# Patient Record
Sex: Female | Born: 1965 | Race: White | Hispanic: No | Marital: Married | Smoking: Never smoker
Health system: Southern US, Community
[De-identification: ages and names within clinical notes are randomized; demographics above are authoritative.]

## PROBLEM LIST (undated history)

## (undated) DIAGNOSIS — I10 Essential (primary) hypertension: Secondary | ICD-10-CM

## (undated) HISTORY — PX: OTHER SURGICAL HISTORY: SHX169

## (undated) HISTORY — DX: Essential (primary) hypertension: I10

---

## 1996-01-12 HISTORY — PX: KIDNEY STONE SURGERY: SHX686

## 2016-09-22 ENCOUNTER — Encounter: Payer: Self-pay | Admitting: *Deleted

## 2016-09-23 ENCOUNTER — Ambulatory Visit (INDEPENDENT_AMBULATORY_CARE_PROVIDER_SITE_OTHER): Payer: Self-pay | Admitting: Neurology

## 2016-09-23 ENCOUNTER — Encounter: Payer: Self-pay | Admitting: Neurology

## 2016-09-23 ENCOUNTER — Ambulatory Visit: Payer: Self-pay | Admitting: Neurology

## 2016-09-23 VITALS — BP 127/81 | HR 87 | Ht 65.0 in | Wt 155.2 lb

## 2016-09-23 DIAGNOSIS — R51 Headache: Secondary | ICD-10-CM | POA: Diagnosis not present

## 2016-09-23 DIAGNOSIS — R29818 Other symptoms and signs involving the nervous system: Secondary | ICD-10-CM | POA: Diagnosis not present

## 2016-09-23 DIAGNOSIS — H02402 Unspecified ptosis of left eyelid: Secondary | ICD-10-CM

## 2016-09-23 DIAGNOSIS — I671 Cerebral aneurysm, nonruptured: Secondary | ICD-10-CM | POA: Diagnosis not present

## 2016-09-23 DIAGNOSIS — H4902 Third [oculomotor] nerve palsy, left eye: Secondary | ICD-10-CM

## 2016-09-23 DIAGNOSIS — R519 Headache, unspecified: Secondary | ICD-10-CM

## 2016-09-23 NOTE — Progress Notes (Signed)
[facility]    Provider:  Dr Jaynee Eagles Referring Provider: Maylon Peppers, MD Primary Care Physician:  Maylon Peppers, MD  CC:  Ptosis of left eye  HPI:  Wanda Villa is a 51 y.o. female here as a referral from Dr. Lenna Gilford for droopy eye. Started about a year ago. She saw an eye doctor, dilated exam looked healthy. She can feel the weakness, she can feel the weakness with difficulty opening the eye. She has irritation in the eye which is worsening, she also has headaches on the right, no hx of migraines, she has had 3-4 headaches, can be pressure and tension, worse on wakening,  the headaches can be manageable, she takes asa nd it helps, sleep helps, no light or sound sensitivity. She can wake with the headache. Never had headaches before, may happen several times a month for 3-4 days. Better in te morning, worse at night or tired. No weakness, no difficulty with vision or breathing, No pupil changes. No rashes, no tick bites, no inciting events or head injury or trauma. No sensory changes. No FHx of autoimmune disorders. She had psoriasis as a child. She does get some rashes on her arms. Eye worsens at night and with lots of physical activity. Eye pain on movement of the left eye. No other focal neurologic deficits, associated symptoms, inciting events or modifiable factors. Husband here and provides much information.  Reviewed notes, labs and imaging from outside physicians, which showed:  Reviewed primary care notes. Patient has a past medical history of hypertension. Complaining of weakness and irritation in the left eye and November 2017, was seen by eye doctor and normal exam, exam was normal including neurologic exam,    Review of Systems: Patient complains of symptoms per HPI as well as the following symptoms: no CP, no SOB. Pertinent negatives and positives per HPI. All others negative.   Social History   Social History  . Marital status: Married    Spouse name:  N/A  . Number of children: N/A  . Years of education: N/A   Occupational History  . Not on file.   Social History Main Topics  . Smoking status: Never Smoker  . Smokeless tobacco: Never Used  . Alcohol use Yes     Comment: occasional  . Drug use: No  . Sexual activity: Not on file   Other Topics Concern  . 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  3 children.      Family History  Problem Relation Age of Onset  . Cancer Father   . Heart disease Father   . Atrial fibrillation Mother   . Autoimmune disease Neg Hx     Past Medical History:  Diagnosis Date  . Hypertension     Past Surgical History:  Procedure Laterality Date  . cesarean    . [place]  . tummy tuck      Current Outpatient Prescriptions  Medication Sig Dispense Refill  . lisinopril (PRINIVIL,ZESTRIL) 5 MG tablet Take 5 mg by mouth daily.    Marland Kitchen neomycin-polymyxin-dexamethasone (MAXITROL) 0.1 % ophthalmic suspension Place 2 drops into the left eye daily.     No current facility-administered medications for this visit.     Allergies as of 09/23/2016 - Review Complete 09/23/2016  Allergen Reaction Noted  . Codeine  09/22/2016    Vitals: BP 127/81   Pulse 87   Ht 5\' 5"  (1.651  m)   Wt 155 lb 3.2 oz (70.4 kg)   BMI 25.83 kg/m  Last Weight:  Wt Readings from Last 1 Encounters:  09/23/16 155 lb 3.2 oz (70.4 kg)   Last Height:   Ht Readings from Last 1 Encounters:  09/23/16 5\' 5"  (1.651 m)    Physical exam: Exam: Gen: NAD, conversant, well nourised, obese, well groomed                     CV: RRR, no MRG. No Carotid Bruits. No peripheral edema, warm, nontender Eyes: Conjunctivae clear without exudates or hemorrhage  Neuro: Detailed Neurologic Exam  Speech:    Speech is normal; fluent and spontaneous with normal comprehension.  Cognition:    The patient is oriented to person, place, and time;     recent and remote memory  intact;     language fluent;     normal attention, concentration,     fund of knowledge Cranial Nerves:    The pupils are equal, round, and reactive to light. The fundi are normal and spontaneous venous pulsations are present. Visual fields are full to finger confrontation. Extraocular movements are intact. Trigeminal sensation is intact and the muscles of mastication are normal. Left Ptosis. The palate elevates in the midline. Hearing intact. Voice is normal. Shoulder shrug is normal. The tongue has normal motion without fasciculations.   Coordination:    Normal finger to nose and heel to shin. Normal rapid alternating movements.   Gait:    Heel-toe and tandem gait are normal.   Motor Observation:    No asymmetry, no atrophy, and no involuntary movements noted. Tone:    Normal muscle tone.    Posture:    Posture is normal. normal erect    Strength:    Strength is V/V in the upper and lower limbs.      Sensation: intact to LT     Reflex Exam:  DTR's:    Deep tendon reflexes in the upper and lower extremities are normal bilaterally.   Toes:    The toes are downgoing bilaterally.   Clonus:    Clonus is absent.  Ice pack test negative    Assessment/Plan:  Patient with left ptosis that worsens woth fatigue or later in the day. Otherwise EOMI and not fatiguable, no proximal weakness, ice pack test was negatibe  MRI brain, MRA head to evaluate for third nerve palsy due to strokes, IIH, aneurysm, lesions MRi orbits for orbital pseudotumor given pain on eye movement Labs today  If unrevealing or suggests Myasthenia Gravis, CT of the chest recommended  Orders Placed This Encounter  Procedures  . MR BRAIN W WO CONTRAST  . MR ORBITS W WO CONTRAST  . MR MRA HEAD WO CONTRAST  . Acetylcholine receptor, binding  . Acetylcholine receptor, blocking  . Acetylcholine receptor, modulating  . CK  . Comprehensive metabolic panel  . CBC    Sarina Ill, MD  [facility] [address] [place], [place] [address]  Phone [phone] Fax [phone]

## 2016-09-23 NOTE — Patient Instructions (Signed)
Remember to drink plenty of fluid, eat healthy meals and do not skip any meals. Try to eat protein with a every meal and eat a healthy snack such as fruit or nuts in between meals. Try to keep a regular sleep-wake schedule and try to exercise daily, particularly in the form of walking, 20-30 minutes a day, if you can.   As far as diagnostic testing: MRI of the brain and orbits, MRA head for blood vessels. May consider chest imaging as well, lab  I would like to see you back in 6 weeks, sooner if we need to. Please call us with any interim questions, concerns, problems, updates or refill requests.    Our phone number is [phone]. We also have an after hours call service for urgent matters and there is a physician on-call for urgent questions. For any emergencies you know to call 911 or go to the nearest emergency room  Myasthenia Gravis Myasthenia gravis (MG) means severe weakness. It is a long-term (chronic) condition that causes weakness in the muscles you can control (voluntary muscles). MG can affect any voluntary muscle. The muscles most often affected are the ones that control:  Eye movement.  Facial movements.  Swallowing.  MG is an autoimmune disease, which means that your body's defense system (immune system) attacks healthy parts of your body instead of germs and other things that make you sick. When you have MG, your immune system makes proteins (antibodies) that block the chemical (acetylcholine) your body needs to send nerve signals to your muscles. This causes muscle weakness. What are the causes? The exact cause of MG is unknown. One possible cause is an enlarged thymus gland, which is located under your breastbone. What are the signs or symptoms? The earliest symptom of MG is muscle weakness that gets worse with activity and gets better after rest. Other symptoms of MG may include:  Drooping eyelids.  Double vision.  Loss of facial expression.  Trouble chewing and  swallowing.  Slurred speech.  A waddling walk.  Weakness of the arms, hands, and legs.  Trouble breathing is the most dangerous symptom of MG. Sudden and severe difficulty breathing (myasthenic crisis) may require emergency breathing support. This symptom sometimes happens after:  Infection.  Fever.  Drug reaction.  How is this diagnosed? It can be hard to diagnose MG because muscle weakness is a common symptom in many conditions. Your health care provider will do a physical exam. You may also have tests that will help make a diagnosis. These may include:  A blood test.  A test using the medicine edrophonium. This medicine increases muscle strength by slowing the breakdown of acetylcholine.  Tests to measure nerve conduction to muscle (electromyography).  An imaging study of the chest (CT or MRI).  How is this treated? Treatment can improve muscle strength. Sometimes symptoms of MG go away for a while (remission) and you can stop treatment. Possible treatments include:  Medicine.  Removal of the thymus gland (thymectomy). This may result in a long remission for some people.  Follow these instructions at home:  Take medicines only as directed by your health care provider.  Get plenty of rest to conserve your energy.  Take frequent breaks to rest your eyes.  Maintain a healthy diet and a healthy weight.  Do not use any tobacco products including cigarettes, chewing tobacco, or electronic cigarettes. If you need help quitting, ask your health care provider.  Keep all follow-up visits as directed by your health care  provider. This is important. Contact a health care provider if:  Your symptoms get worse after a fever or infection.  You have a reaction to a medicine you are taking.  Your symptoms change or get worse. Get help right away if: You have trouble breathing. This information is not intended to replace advice given to you by your health care provider. Make  sure you discuss any questions you have with your health care provider. Document Released: 04/05/2000 Document Revised: 06/05/2015 Document Reviewed: 02/28/2013 Elsevier Interactive Patient Education  [employer].

## 2016-09-26 ENCOUNTER — Encounter: Payer: Self-pay | Admitting: Neurology

## 2016-09-26 DIAGNOSIS — H02402 Unspecified ptosis of left eyelid: Secondary | ICD-10-CM | POA: Insufficient documentation

## 2016-09-27 ENCOUNTER — Telehealth: Payer: Self-pay | Admitting: Neurology

## 2016-09-27 NOTE — Telephone Encounter (Signed)
Rescind the MRA and we will just do the MRI orbits ans bran and can consider MRA later thanks

## 2016-09-27 NOTE — Telephone Encounter (Signed)
I was unable to get the MRA approved because of the combination of the MRI Brain and MRA Head together.. I did get the MRI Brain and MR Orbits approved. They are linked together I was unable to get a authorization number. The phone number for the peer to peer is [phone]. The member ID is [id number] & the DOB is 02/13/1965. The case does close on Wednesday 09/29/16.

## 2016-09-27 NOTE — Telephone Encounter (Signed)
I called [employer] and withdrew the MRA head and will proceed with the MRI Brain & MR Orbits.

## 2016-09-28 ENCOUNTER — Telehealth: Payer: Self-pay | Admitting: *Deleted

## 2016-09-28 LAB — COMPREHENSIVE METABOLIC PANEL
A/G RATIO: 1.9 (ref 1.2–2.2)
ALT: 26 IU/L (ref 0–32)
AST: 29 IU/L (ref 0–40)
Albumin: 4.6 g/dL (ref 3.5–5.5)
Alkaline Phosphatase: 52 IU/L (ref 39–117)
BUN/Creatinine Ratio: 16 (ref 9–23)
BUN: 13 mg/dL (ref 6–24)
Bilirubin Total: 0.7 mg/dL (ref 0.0–1.2)
CALCIUM: 9.7 mg/dL (ref 8.7–10.2)
CO2: 26 mmol/L (ref 20–29)
CREATININE: 0.8 mg/dL (ref 0.57–1.00)
Chloride: 101 mmol/L (ref 96–106)
GFR, EST AFRICAN AMERICAN: 100 mL/min/{1.73_m2} (ref >59)
GFR, EST NON AFRICAN AMERICAN: 87 mL/min/{1.73_m2} (ref >59)
Globulin, Total: 2.4 g/dL (ref 1.5–4.5)
Glucose: 86 mg/dL (ref 65–99)
Potassium: 4.7 mmol/L (ref 3.5–5.2)
Sodium: 140 mmol/L (ref 134–144)
TOTAL PROTEIN: 7 g/dL (ref 6.0–8.5)

## 2016-09-28 LAB — CBC
HEMATOCRIT: 41.8 % (ref 34.0–46.6)
Hemoglobin: 13.6 g/dL (ref 11.1–15.9)
MCH: 29.1 pg (ref 26.6–33.0)
MCHC: 32.5 g/dL (ref 31.5–35.7)
MCV: 90 fL (ref 79–97)
Platelets: 227 10*3/uL (ref 150–379)
RBC: 4.67 x10E6/uL (ref 3.77–5.28)
RDW: 14.6 % (ref 12.3–15.4)
WBC: 4.7 10*3/uL (ref 3.4–10.8)

## 2016-09-28 LAB — ACETYLCHOLINE RECEPTOR, BLOCKING: Acetylchol Block Ab: 11 % (ref 0–25)

## 2016-09-28 LAB — CK: CK TOTAL: 109 U/L (ref 24–173)

## 2016-09-28 LAB — ACETYLCHOLINE RECEPTOR, MODULATING: Acetylcholine Modulat Ab: <12 % (ref 0–20)

## 2016-09-28 LAB — ACETYLCHOLINE RECEPTOR, BINDING: AChR Binding Ab, Serum: <0.03 nmol/L (ref 0.00–0.24)

## 2016-09-28 NOTE — Telephone Encounter (Signed)
-----   Message from Melvenia Beam, MD sent at 09/28/2016  3:50 PM EDT ----- Labs normal

## 2016-09-28 NOTE — Telephone Encounter (Signed)
Called and spoke with patient about normal labs per AA,MD note. Pt verbalized understanding.  She is going for imaging tomorrow. Advised we will call with results once back.

## 2016-09-29 ENCOUNTER — Ambulatory Visit (INDEPENDENT_AMBULATORY_CARE_PROVIDER_SITE_OTHER): Payer: BLUE CROSS/BLUE SHIELD

## 2016-09-29 DIAGNOSIS — H02402 Unspecified ptosis of left eyelid: Secondary | ICD-10-CM | POA: Diagnosis not present

## 2016-09-29 DIAGNOSIS — R519 Headache, unspecified: Secondary | ICD-10-CM

## 2016-09-29 DIAGNOSIS — R51 Headache: Secondary | ICD-10-CM

## 2016-09-29 DIAGNOSIS — H4902 Third [oculomotor] nerve palsy, left eye: Secondary | ICD-10-CM

## 2016-09-29 DIAGNOSIS — R29818 Other symptoms and signs involving the nervous system: Secondary | ICD-10-CM

## 2016-09-29 MED ORDER — GADOPENTETATE DIMEGLUMINE 469.01 MG/ML IV SOLN: 20.0000 mL | Freq: Once | INTRAVENOUS | Status: AC | PRN

## 2016-10-03 ENCOUNTER — Other Ambulatory Visit: Payer: Self-pay | Admitting: Neurology

## 2016-10-03 DIAGNOSIS — H02402 Unspecified ptosis of left eyelid: Secondary | ICD-10-CM

## 2016-10-03 DIAGNOSIS — D150 Benign neoplasm of thymus: Secondary | ICD-10-CM

## 2016-10-03 DIAGNOSIS — I671 Cerebral aneurysm, nonruptured: Secondary | ICD-10-CM

## 2016-10-03 DIAGNOSIS — H4902 Third [oculomotor] nerve palsy, left eye: Secondary | ICD-10-CM

## 2016-10-03 DIAGNOSIS — D4989 Neoplasm of unspecified behavior of other specified sites: Secondary | ICD-10-CM

## 2016-10-03 DIAGNOSIS — G529 Cranial nerve disorder, unspecified: Secondary | ICD-10-CM

## 2016-10-03 DIAGNOSIS — G7000 Myasthenia gravis without (acute) exacerbation: Secondary | ICD-10-CM

## 2016-10-12 ENCOUNTER — Ambulatory Visit
Admission: RE | Admit: 2016-10-12 | Discharge: 2016-10-12 | Disposition: A | Discharge status: Home / Self Care | Payer: BLUE CROSS/BLUE SHIELD | Source: Ambulatory Visit | Attending: Neurology | Admitting: Neurology

## 2016-10-12 DIAGNOSIS — I671 Cerebral aneurysm, nonruptured: Secondary | ICD-10-CM

## 2016-10-12 DIAGNOSIS — G529 Cranial nerve disorder, unspecified: Secondary | ICD-10-CM

## 2016-10-12 DIAGNOSIS — D4989 Neoplasm of unspecified behavior of other specified sites: Secondary | ICD-10-CM

## 2016-10-12 DIAGNOSIS — H4902 Third [oculomotor] nerve palsy, left eye: Secondary | ICD-10-CM | POA: Diagnosis not present

## 2016-10-12 DIAGNOSIS — H02402 Unspecified ptosis of left eyelid: Secondary | ICD-10-CM

## 2016-10-12 DIAGNOSIS — D150 Benign neoplasm of thymus: Secondary | ICD-10-CM

## 2016-10-12 DIAGNOSIS — G7000 Myasthenia gravis without (acute) exacerbation: Secondary | ICD-10-CM

## 2016-10-12 MED ORDER — IOPAMIDOL (ISOVUE-300) INJECTION 61%
75.0000 mL | Freq: Once | INTRAVENOUS | Status: AC | PRN
  Administered 2016-10-12: 75 mL via INTRAVENOUS

## 2016-10-13 ENCOUNTER — Telehealth: Payer: Self-pay

## 2016-10-13 IMAGING — CT CT CHEST W/ CM
1 of 2 series · 15 of 32 positions shown, 19 images · IV contrast (APPLIED)
Comparison: No priors.

CLINICAL DATA: 51-year-old female with history of myasthenia
gravis. Evaluate for potential thymoma.

EXAM:
CT CHEST WITH CONTRAST
TECHNIQUE: Multidetector CT imaging of the chest was performed during
intravenous contrast administration.
CONTRAST:  75mL [P4] IOPAMIDOL ([P4]) INJECTION 61%

[Series 4: super d · axial · 0.69mm/px · z∈[-310,-20]mm · 15 of 461 slices shown, 19 images]
[im 24/461  mediastinal]
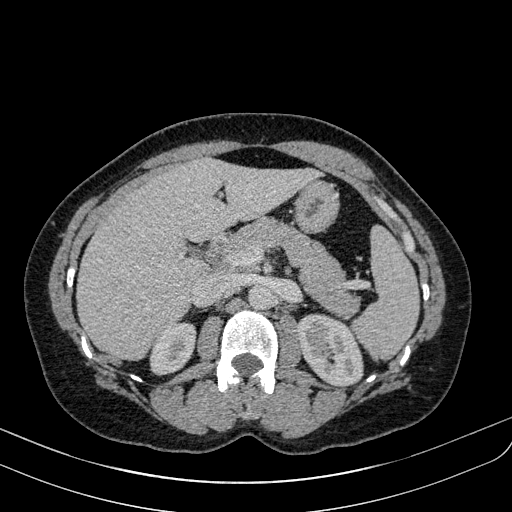
[im 24/461  lung]
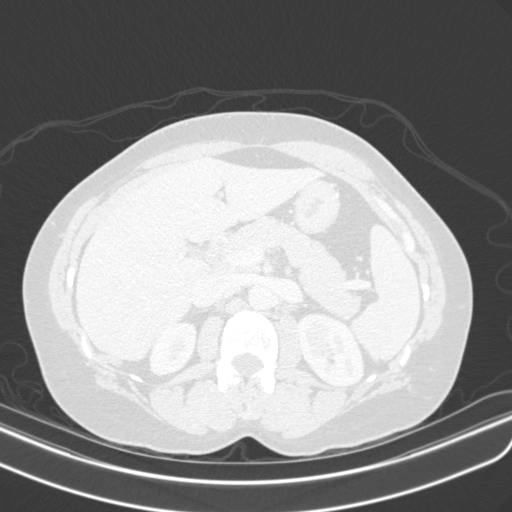
[im 70/461  lung]
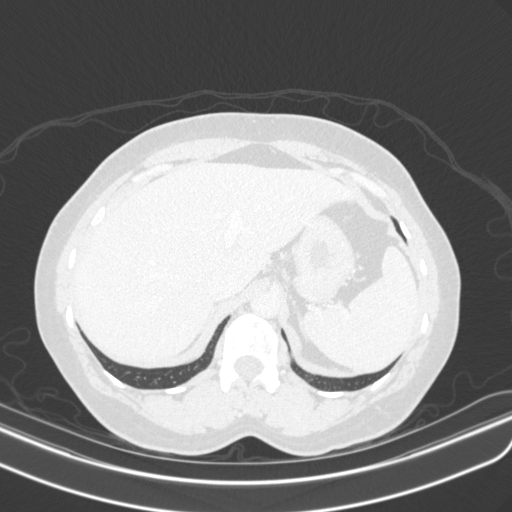
[im 93/461  lung]
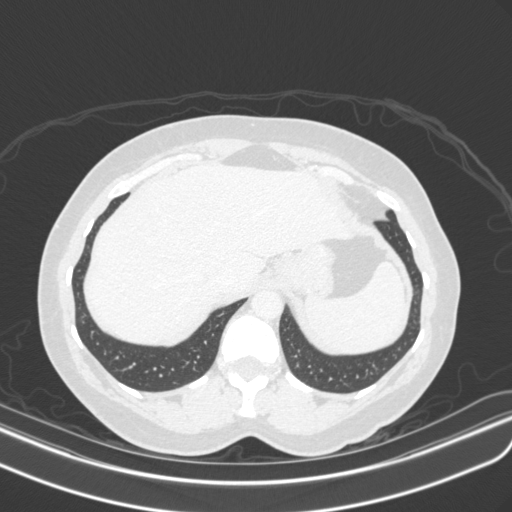
[im 139/461  lung]
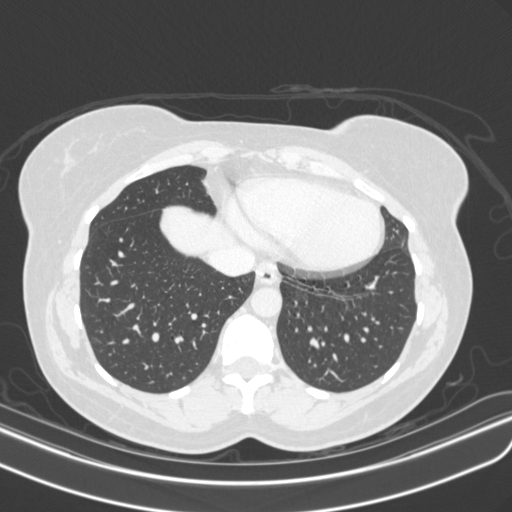
[im 154/461  mediastinal]
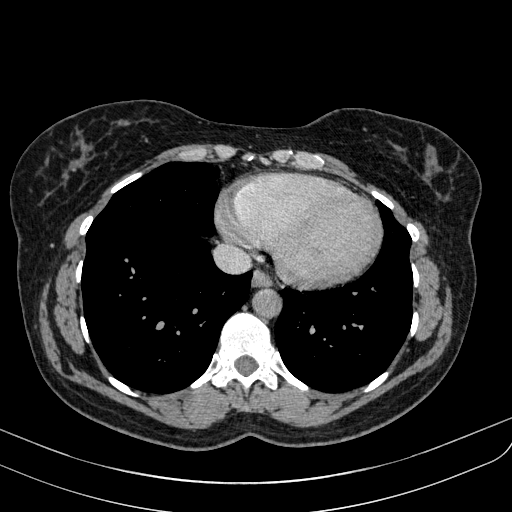
[im 154/461  lung]
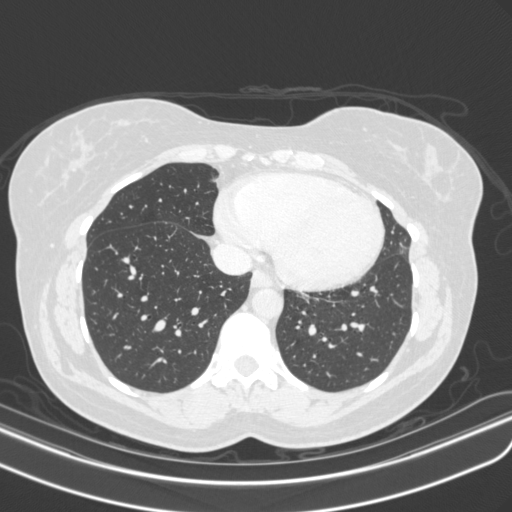
[im 185/461  lung]
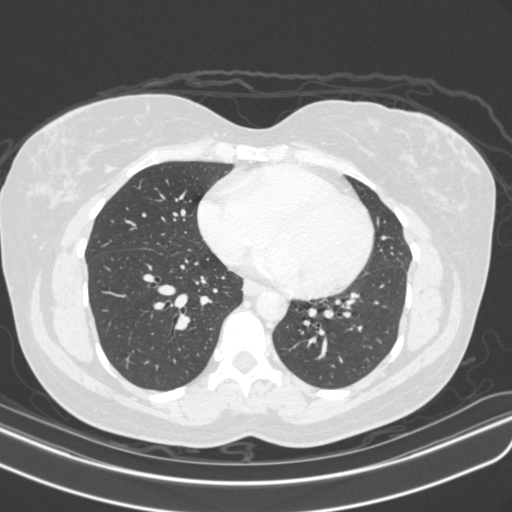
[im 208/461  lung]
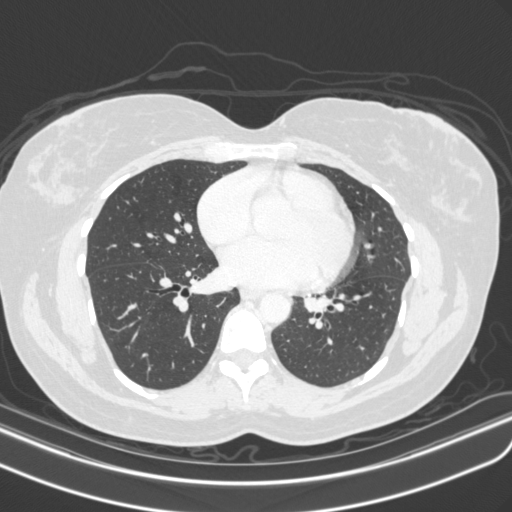
[im 231/461  lung]
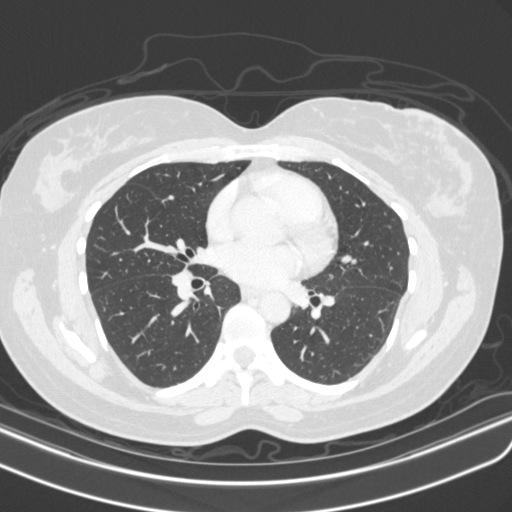
[im 254/461  mediastinal]
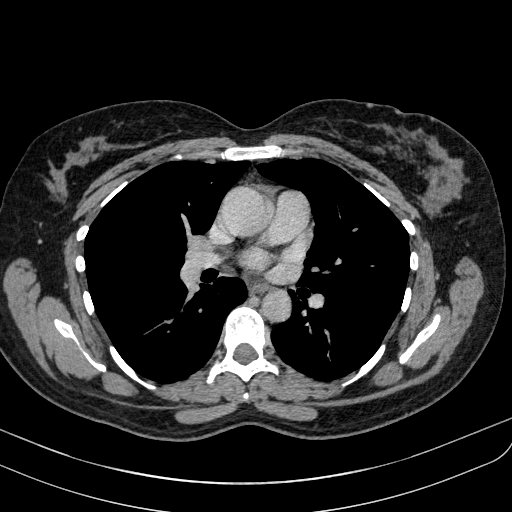
[im 254/461  lung]
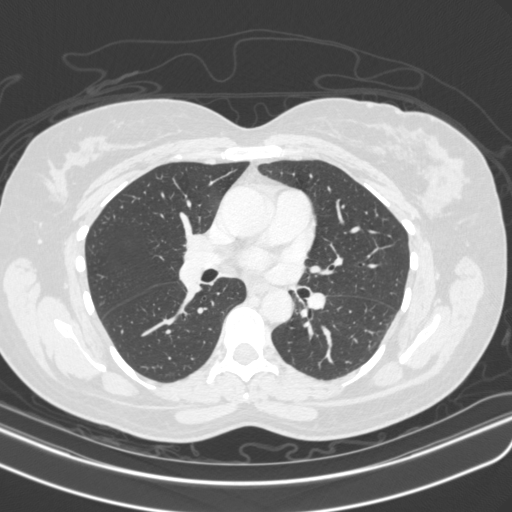
[im 277/461  lung]
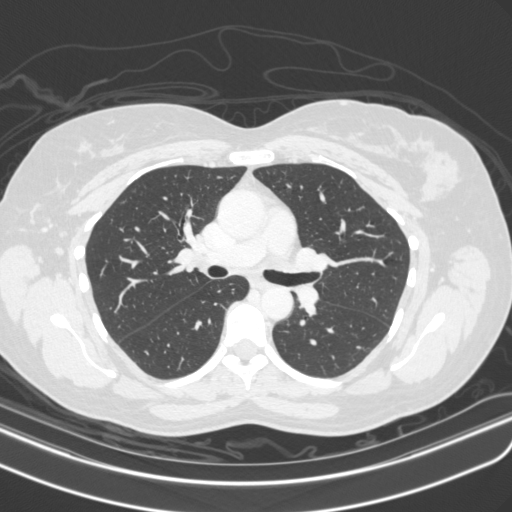
[im 307/461  lung]
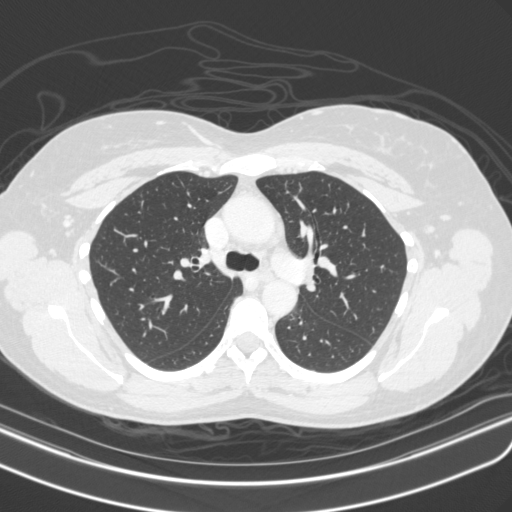
[im 323/461  lung]
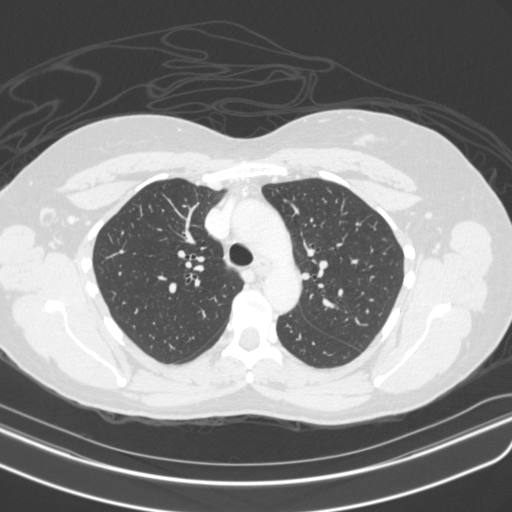
[im 369/461  mediastinal]
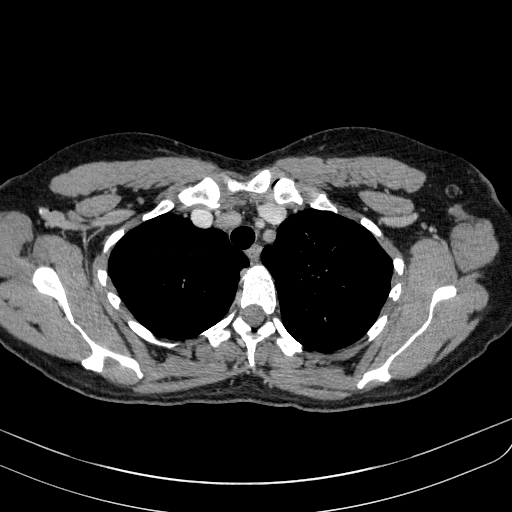
[im 369/461  lung]
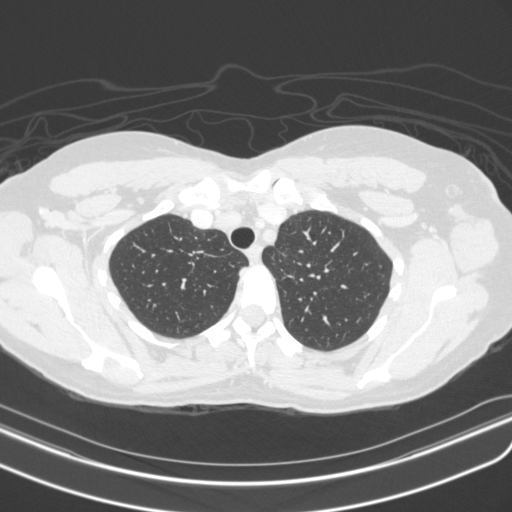
[im 392/461  lung]
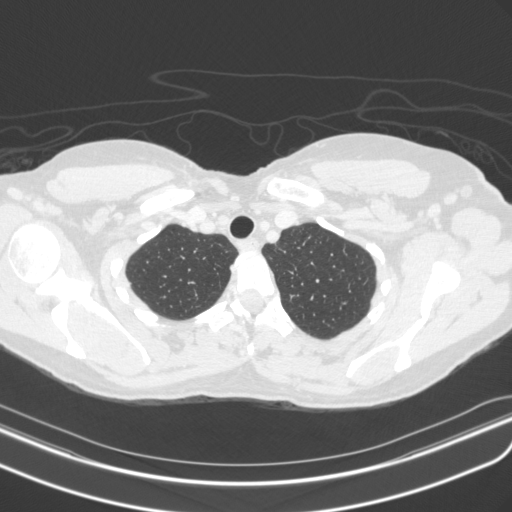
[im 438/461  lung]
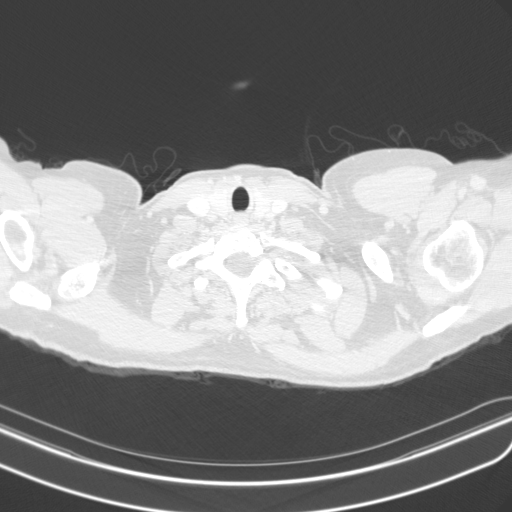

[15 of 32 positions shown; findings below may reference images not displayed]

FINDINGS: Cardiovascular: Heart size is normal. There is no significant
pericardial fluid, thickening or pericardial calcification. No
significant atherosclerotic disease, aneurysm or dissection of the
thoracic aorta. No coronary artery calcifications are noted.

Mediastinum/Nodes: No unexpected soft tissue mass in the anterior
mediastinum to suggest the presence of thymoma. No pathologically
enlarged mediastinal or hilar lymph nodes. Esophagus is unremarkable
in appearance. No axillary lymphadenopathy.

Lungs/Pleura: No suspicious appearing pulmonary nodules or masses.
No acute consolidative airspace disease. No pleural effusions.

Upper Abdomen: Unremarkable.

Musculoskeletal: There are no aggressive appearing lytic or blastic
lesions noted in the visualized portions of the skeleton.
IMPRESSION: 1. No evidence of thymoma.
2. No acute findings noted in the thorax.

## 2016-10-13 NOTE — Telephone Encounter (Signed)
-----   Message from Lester Spalding, RN sent at 10/13/2016  4:09 PM EDT -----   ----- Message ----- From: Melvenia Beam, MD Sent: 10/12/2016   7:58 PM To: Lester [place], RN  CT of the chest normal thanks

## 2016-10-13 NOTE — Telephone Encounter (Signed)
I spoke with patient and she is aware that CT of the chest is normal. She asked about her MRI/MRA results but I made her aware that they had not been read yet but that we would call her just as soon as those results are back. Patient voiced understanding.

## 2016-10-15 ENCOUNTER — Telehealth: Payer: Self-pay | Admitting: *Deleted

## 2016-10-15 NOTE — Telephone Encounter (Signed)
Patient called and discussed results of MRA. No further concerns. Patient to follow up at next appointment on 11/04/16.

## 2016-11-04 ENCOUNTER — Ambulatory Visit (INDEPENDENT_AMBULATORY_CARE_PROVIDER_SITE_OTHER): Payer: BLUE CROSS/BLUE SHIELD | Admitting: Neurology

## 2016-11-04 ENCOUNTER — Other Ambulatory Visit: Payer: Self-pay | Admitting: Neurology

## 2016-11-04 ENCOUNTER — Telehealth: Payer: Self-pay

## 2016-11-04 ENCOUNTER — Encounter: Payer: Self-pay | Admitting: Neurology

## 2016-11-04 VITALS — BP 135/87 | HR 88 | Wt 158.8 lb

## 2016-11-04 DIAGNOSIS — G7000 Myasthenia gravis without (acute) exacerbation: Secondary | ICD-10-CM

## 2016-11-04 MED ORDER — PYRIDOSTIGMINE BROMIDE 60 MG PO TABS: 60.0000 mg | ORAL_TABLET | Freq: Three times a day (TID) | ORAL | 6 refills | Status: DC

## 2016-11-04 MED ORDER — PYRIDOSTIGMINE BROMIDE 60 MG PO TABS: 60.0000 mg | ORAL_TABLET | Freq: Three times a day (TID) | ORAL | 6 refills | Status: AC

## 2016-11-04 NOTE — Telephone Encounter (Signed)
Patient's prescription today was sent to [facility], [place], she needs it resent to the [facility] in [place], [place], thanks!

## 2016-11-04 NOTE — Progress Notes (Signed)
[id number]    Provider:  Dr Jaynee Eagles Referring Provider: Maylon Peppers, MD Primary Care Physician:  Maylon Peppers, MD  CC:  Ptosis of left eye  Interval history 10/25 2018: Patient is here for follow-up of left ptosis. Extensive testing including MRI is, MRA, lab work including myasthenia gravis antibodies have been negative. Ptosis is continuous and fluctuates based on time of day, negative ice pack test, unclear if this is ocular myasthenia gravis. Next is could be single fiber EMG, monitoring clinically for further progression, or treating with a course of high-dose steroids and or Mestinon. No vision problems. She has migraines but the ptosis is not coincident with migraines.   HPI:  Wanda Villa is a 51 y.o. female here as a referral from Dr. Lenna Gilford for droopy eye. Started about a year ago. She saw an eye doctor, dilated exam looked healthy. She can feel the weakness, she can feel the weakness with difficulty opening the eye. She has irritation in the eye which is worsening, she also has headaches on the right, no hx of migraines, she has had 3-4 headaches, can be pressure and tension, worse on wakening,  the headaches can be manageable, she takes asa nd it helps, sleep helps, no light or sound sensitivity. She can wake with the headache. Never had headaches before, may happen several times a month for 3-4 days. Better in te morning, worse at night or tired. No weakness, no difficulty with vision or breathing, No pupil changes. No rashes, no tick bites, no inciting events or head injury or trauma. No sensory changes. No FHx of autoimmune disorders. She had psoriasis as a child. She does get some rashes on her arms. Eye worsens at night and with lots of physical activity. Eye pain on movement of the left eye. No other focal neurologic deficits, associated symptoms, inciting events or modifiable factors. Husband here and provides much information.  Reviewed notes, labs and  imaging from outside physicians, which showed:  Reviewed primary care notes. Patient has a past medical history of hypertension. Complaining of weakness and irritation in the left eye and November 2017, was seen by eye doctor and normal exam, exam was normal including neurologic exam,    Review of Systems: Patient complains of symptoms per HPI as well as the following symptoms: no CP, no SOB. Pertinent negatives and positives per HPI. All others negative.  Social History   Social History  . Marital status: Married    Spouse name: N/A  . Number of children: N/A  . Years of education: N/A   Occupational History  . Not on file.   Social History Main Topics  . Smoking status: Never Smoker  . Smokeless tobacco: Never Used  . Alcohol use Yes     Comment: occasional  . Drug use: No  . Sexual activity: Not on file   Other Topics Concern  . 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  3 children.      Family History  Problem Relation Age of Onset  . Cancer Father   . Heart disease Father   . Atrial fibrillation Mother   . Autoimmune disease Neg Hx     Past Medical History:  Diagnosis Date  . Hypertension     Past Surgical History:  Procedure Laterality Date  . cesarean    . [place]  . tummy tuck      Current  Outpatient Prescriptions  Medication Sig Dispense Refill  . pyridostigmine (MESTINON) 60 MG tablet Take 1 tablet (60 mg total) by mouth 3 (three) times daily. 90 tablet 6   No current facility-administered medications for this visit.    Facility-Administered Medications Ordered in Other Visits  Medication Dose Route Frequency Provider Last Rate Last Dose  . gadopentetate dimeglumine (MAGNEVIST) injection 20 mL  20 mL Intravenous Once PRN Melvenia Beam, MD        Allergies as of 11/04/2016 - Review Complete 11/04/2016  Allergen Reaction Noted  . Codeine  09/22/2016    Vitals: BP 135/87    Pulse 88   Wt 158 lb 12.8 oz (72 kg)   BMI 26.43 kg/m  Last Weight:  Wt Readings from Last 1 Encounters:  11/04/16 158 lb 12.8 oz (72 kg)   Last Height:   Ht Readings from Last 1 Encounters:  09/23/16 5\' 5"  (1.651 m)   Physical exam: Exam: Gen: NAD, conversant, well nourised, obese, well groomed                     CV: RRR, no MRG. No Carotid Bruits. No peripheral edema, warm, nontender Eyes: Conjunctivae clear without exudates or hemorrhage  Neuro: Detailed Neurologic Exam  Speech:    Speech is normal; fluent and spontaneous with normal comprehension.  Cognition:    The patient is oriented to person, place, and time;     recent and remote memory intact;     language fluent;     normal attention, concentration,     fund of knowledge Cranial Nerves:    The pupils are equal, round, and reactive to light. The fundi are normal and spontaneous venous pulsations are present. Visual fields are full to finger confrontation. Extraocular movements are intact. Trigeminal sensation is intact and the muscles of mastication are normal. Left Ptosis. The palate elevates in the midline. Hearing intact. Voice is normal. Shoulder shrug is normal. The tongue has normal motion without fasciculations.   Coordination:    Normal finger to nose and heel to shin. Normal rapid alternating movements.   Gait:    Heel-toe and tandem gait are normal.   Motor Observation:    No asymmetry, no atrophy, and no involuntary movements noted. Tone:    Normal muscle tone.    Posture:    Posture is normal. normal erect    Strength:    Strength is V/V in the upper and lower limbs.      Sensation: intact to LT     Reflex Exam:  DTR's:    Deep tendon reflexes in the upper and lower extremities are normal bilaterally.   Toes:    The toes are downgoing bilaterally.   Clonus:    Clonus is absent.    Assessment/Plan:  Patient with left ptosis that worsens woth fatigue or later in the day. Otherwise  EOMI and not fatiguable, no proximal weakness, ice pack test was negative. Extensive testing including MRI is, MRA, lab work including myasthenia gravis antibodies have been negative. Unclear if this is myasthenia gravis. Next step could be single fiber EMG, monitoring clinically for further progression, or treating with a course of high-dose steroids and/or Mestinon, second opinion.  - Discussed, will try mestinon with second opinion by Dr. Krista Blue. May also consider f/u [employer] testing.   MRI brain, MRA head to evaluate for third nerve palsy due to strokes, IIH, aneurysm, lesions: were normal MRi orbits for orbital pseudotumor given pain on  eye movement: Normal Labs today: all normal including myasthenia antibodies CT chest negative  Sarina Ill, MD  [facility] [address] [place],  [address]  Phone [phone] Fax [phone]  A total of 15 minutes was spent face-to-face with this patient. Over half this time was spent on counseling patient on the left ptosis diagnosis and different diagnostic and therapeutic options available.

## 2016-11-04 NOTE — Patient Instructions (Signed)
Pyridostigmine tablets, regular release What is this medicine? PYRIDOSTIGMINE (peer id oh STIG meen) can help with muscle strength. It is used to treat myasthenia gravis. This medicine may be used for other purposes; ask your health care provider or pharmacist if you have questions. COMMON BRAND NAME(S): Mestinon What should I tell my health care provider before I take this medicine? They need to know if you have any of these conditions: -asthma -difficulty passing urine -heart disease -infection in abdomen, peritonitis -irregular, slow heartbeat -kidney disease -seizures -stomach or bowel obstruction or ulcers -thyroid disease -an unusual or allergic reaction to pyridostigmine, bromides, other medicines, foods, dyes, or preservatives -pregnant or trying to get pregnant -breast-feeding How should I use this medicine? Take this medicine by mouth with a glass of water. Follow the directions on the prescription label. Take your medicine at regular intervals. Do not take your medicine more often than directed. Do not stop taking except on your doctor's advice. Talk to your pediatrician regarding the use of this medicine in children. Special care may be needed. Overdosage: If you think you have taken too much of this medicine contact a poison control center or emergency room at once. NOTE: This medicine is only for you. Do not share this medicine with others. What if I miss a dose? If you miss a dose, take it as soon as you can. If it is almost time for your next dose, take only that dose. Do not take double or extra doses. What may interact with this medicine? Do not take this medicine with any of the following medications: -other medicines for myasthenia gravis like neostigmine -quinine This medicine may also interact with the following medications: -atropine -bethanechol -disopyramide -edrophonium -guanadrel -guanethidine -mecamylamine -medicines that block muscle or nerve  pain This list may not describe all possible interactions. Give your health care provider a list of all the medicines, herbs, non-prescription drugs, or dietary supplements you use. Also tell them if you smoke, drink alcohol, or use illegal drugs. Some items may interact with your medicine. What should I watch for while using this medicine? Visit your doctor or health care professional for regular checks on your progress. Tell your doctor if your symptoms do not improve or if they get worse. Wear a medical ID bracelet or chain, and carry a card that describes your disease and details of your medicine and dosage times. What side effects may I notice from receiving this medicine? Side effects that you should report to your doctor or health care professional as soon as possible: -allergic reactions like skin rash, itching or hives, swelling of the face, lips, or tongue -breathing problems -changes in vision -muscle cramps, spasm -slow or irregular heartbeat -stomach cramps, pain -unusually weak or tired -vomiting Side effects that usually do not require medical attention (report to your doctor or health care professional if they continue or are bothersome): -diarrhea, especially at start of treatment -increased saliva -increased sweating -nausea This list may not describe all possible side effects. Call your doctor for medical advice about side effects. You may report side effects to FDA at 1-800-FDA-1088. Where should I keep my medicine? Keep out of the reach of children. Store at room temperature between 15 and 30 degrees C (59 and 86 degrees F). Keep container tightly closed. Throw away any unused medicine after the expiration date. NOTE: This sheet is a summary. It may not cover all possible information. If you have questions about this medicine, talk to your doctor, pharmacist, or  health care provider.  2018 [employer] (2007-07-27 15:11:50)

## 2016-11-04 NOTE — Telephone Encounter (Signed)
This has been sent to the correct pharmacy now

## 2016-12-15 ENCOUNTER — Telehealth: Payer: Self-pay | Admitting: Neurology

## 2016-12-15 NOTE — Telephone Encounter (Signed)
Patient is calling to see if Dr.Ahern knows of a local opthalmologist that does Lasik surgery. She does not want to go all the way to [place].

## 2016-12-16 NOTE — Telephone Encounter (Signed)
Called patient and informed her that Dr. Jaynee Eagles does not know of a local opthamologist that does lasik surgery. She verbalized understanding and appreciation.

## 2016-12-16 NOTE — Telephone Encounter (Signed)
Unfortunately I do not, sorry

## 2016-12-22 ENCOUNTER — Telehealth: Payer: Self-pay | Admitting: *Deleted

## 2016-12-22 ENCOUNTER — Ambulatory Visit: Payer: Self-pay | Admitting: Neurology

## 2016-12-22 NOTE — Telephone Encounter (Signed)
No showed follow up appointment.
